# Patient Record
Sex: Female | Born: 1945 | Race: White | Hispanic: No | Smoking: Former smoker
Health system: Southern US, Community
[De-identification: ages and names within clinical notes are randomized; demographics above are authoritative.]

## PROBLEM LIST (undated history)

## (undated) DIAGNOSIS — I10 Essential (primary) hypertension: Secondary | ICD-10-CM

## (undated) HISTORY — PX: ABDOMINAL HYSTERECTOMY: SHX81

## (undated) HISTORY — PX: APPENDECTOMY: SHX54

---

## 2019-06-13 ENCOUNTER — Emergency Department (HOSPITAL_COMMUNITY): Payer: Medicare Other

## 2019-06-13 ENCOUNTER — Other Ambulatory Visit: Payer: Self-pay

## 2019-06-13 ENCOUNTER — Emergency Department (HOSPITAL_COMMUNITY)
Admission: EM | Admit: 2019-06-13 | Discharge: 2019-06-13 | Disposition: A | Discharge status: Home / Self Care | Payer: Medicare Other | Attending: Emergency Medicine | Admitting: Emergency Medicine

## 2019-06-13 DIAGNOSIS — R197 Diarrhea, unspecified: Secondary | ICD-10-CM | POA: Insufficient documentation

## 2019-06-13 DIAGNOSIS — R1030 Lower abdominal pain, unspecified: Secondary | ICD-10-CM | POA: Diagnosis not present

## 2019-06-13 DIAGNOSIS — K5900 Constipation, unspecified: Secondary | ICD-10-CM | POA: Diagnosis not present

## 2019-06-13 DIAGNOSIS — Z79899 Other long term (current) drug therapy: Secondary | ICD-10-CM | POA: Diagnosis not present

## 2019-06-13 DIAGNOSIS — R109 Unspecified abdominal pain: Secondary | ICD-10-CM

## 2019-06-13 LAB — COMPREHENSIVE METABOLIC PANEL
ALT: 20 U/L (ref 0–44)
AST: 32 U/L (ref 15–41)
Albumin: 4.3 g/dL (ref 3.5–5.0)
Alkaline Phosphatase: 52 U/L (ref 38–126)
Anion gap: 11 (ref 5–15)
BUN: 18 mg/dL (ref 8–23)
CO2: 24 mmol/L (ref 22–32)
Calcium: 8.9 mg/dL (ref 8.9–10.3)
Chloride: 105 mmol/L (ref 98–111)
Creatinine, Ser: 0.92 mg/dL (ref 0.44–1.00)
GFR calc Af Amer: >60 mL/min (ref >60)
GFR calc non Af Amer: >60 mL/min (ref >60)
Glucose, Bld: 99 mg/dL (ref 70–99)
Potassium: 4.4 mmol/L (ref 3.5–5.1)
Sodium: 140 mmol/L (ref 135–145)
Total Bilirubin: 0.9 mg/dL (ref 0.3–1.2)
Total Protein: 7.6 g/dL (ref 6.5–8.1)

## 2019-06-13 LAB — CBC WITH DIFFERENTIAL/PLATELET
Abs Immature Granulocytes: 0.01 10*3/uL (ref 0.00–0.07)
Basophils Absolute: 0.1 10*3/uL (ref 0.0–0.1)
Basophils Relative: 1 %
Eosinophils Absolute: 0.1 10*3/uL (ref 0.0–0.5)
Eosinophils Relative: 2 %
HCT: 39.4 % (ref 36.0–46.0)
Hemoglobin: 13.2 g/dL (ref 12.0–15.0)
Immature Granulocytes: 0 %
Lymphocytes Relative: 36 %
Lymphs Abs: 2.4 10*3/uL (ref 0.7–4.0)
MCH: 32.7 pg (ref 26.0–34.0)
MCHC: 33.5 g/dL (ref 30.0–36.0)
MCV: 97.5 fL (ref 80.0–100.0)
Monocytes Absolute: 0.5 10*3/uL (ref 0.1–1.0)
Monocytes Relative: 7 %
Neutro Abs: 3.5 10*3/uL (ref 1.7–7.7)
Neutrophils Relative %: 54 %
Platelets: 259 10*3/uL (ref 150–400)
RBC: 4.04 MIL/uL (ref 3.87–5.11)
RDW: 11.6 % (ref 11.5–15.5)
WBC: 6.6 10*3/uL (ref 4.0–10.5)
nRBC: 0 % (ref 0.0–0.2)

## 2019-06-13 LAB — URINALYSIS, ROUTINE W REFLEX MICROSCOPIC
Bacteria, UA: NONE SEEN
Bilirubin Urine: NEGATIVE
Glucose, UA: NEGATIVE mg/dL
Hgb urine dipstick: NEGATIVE
Ketones, ur: 5 mg/dL — AB
Nitrite: NEGATIVE
Protein, ur: NEGATIVE mg/dL
Specific Gravity, Urine: >1.046 — ABNORMAL HIGH (ref 1.005–1.030)
pH: 7 (ref 5.0–8.0)

## 2019-06-13 LAB — LIPASE, BLOOD: Lipase: 66 U/L — ABNORMAL HIGH (ref 11–51)

## 2019-06-13 MED ORDER — IOHEXOL 300 MG/ML  SOLN
100.0000 mL | Freq: Once | INTRAMUSCULAR | Status: AC | PRN
  Administered 2019-06-13: 100 mL via INTRAVENOUS

## 2019-06-13 NOTE — ED Provider Notes (Signed)
[facility]   CSN: [id number] Arrival date & time: 06/13/19  [id number]     History   Chief Complaint Chief Complaint  Patient presents with  . Abdominal Pain    HPI Lauren Leon is a 73 y.o. female.     HPI 73 year old female presents the emergency department worsening lower abdominal pain over the past 5 days.  Currently pain is 2 out of 10.  She denies nausea vomiting.  She has some intermittent constipation and diarrhea.  She has a history of diverticulitis.  She was seen at an urgent care recently and started on what sounds like ciprofloxacin and Flagyl which she completed a course.  She felt like she was doing much better but now her pain is since returned and is worsening over the past 5 days     No past medical history on file.  There are no active problems to display for this patient.   ** The histories are not reviewed yet. Please review them in the "History" navigator section and refresh this SmartLink.   OB History   No obstetric history on file.      Home Medications    Prior to Admission medications   Medication Sig Start Date End Date Taking? Authorizing Provider  Magnesium 250 MG TABS Take 250 mg by mouth 2 (two) times a day.   Yes [provider]  Multiple Vitamin (MULTIVITAMIN WITH MINERALS) TABS tablet Take 1 tablet by mouth daily.   Yes [provider]  pravastatin (PRAVACHOL) 40 MG tablet Take 40 mg by mouth daily.   Yes [provider]  Probiotic Product (PROBIOTIC PO) Take 1 capsule by mouth daily.   Yes [provider]    Family History No family history on file.  Social History Social History   Tobacco Use  . Smoking status: Not on file  Substance Use Topics  . Alcohol use: Not on file  . Drug use: Not on file     Allergies   Bactrim [sulfamethoxazole-trimethoprim] and Bee venom   Review of Systems Review of Systems  All other systems  reviewed and are negative.    Physical Exam Updated Vital Signs BP (!) 163/72   Pulse 73   Temp 98.1 F (36.7 C) (Oral)   Resp 18   Ht 5' (1.524 m)   Wt 53.1 kg   SpO2 98%   BMI 22.85 kg/m   Physical Exam Vitals signs and nursing note reviewed.  Constitutional:      General: She is not in acute distress.    Appearance: She is well-developed.  HENT:     Head: Normocephalic and atraumatic.  Neck:     Musculoskeletal: Normal range of motion.  Cardiovascular:     Rate and Rhythm: Normal rate and regular rhythm.     Heart sounds: Normal heart sounds.  Pulmonary:     Effort: Pulmonary effort is normal.     Breath sounds: Normal breath sounds.  Abdominal:     General: There is no distension.     Palpations: Abdomen is soft.     Tenderness: There is abdominal tenderness in the left lower quadrant.  Musculoskeletal: Normal range of motion.  Skin:    General: Skin is warm and dry.  Neurological:     Mental Status: She is alert and oriented to person, place, and time.  Psychiatric:        Judgment: Judgment normal.      ED Treatments /  Results  Labs (all labs ordered are listed, but only abnormal results are displayed) Labs Reviewed  LIPASE, BLOOD - Abnormal; Notable for the following components:      Result Value   Lipase 66 (*)    All other components within normal limits  URINALYSIS, ROUTINE W REFLEX MICROSCOPIC - Abnormal; Notable for the following components:   Color, Urine STRAW (*)    Specific Gravity, Urine >1.046 (*)    Ketones, ur 5 (*)    Leukocytes,Ua TRACE (*)    All other components within normal limits  CBC WITH DIFFERENTIAL/PLATELET  COMPREHENSIVE METABOLIC PANEL    EKG    Radiology Ct Abdomen Pelvis W Contrast  Result Date: 06/13/2019 CLINICAL DATA:  Abdominal pain. Recent antibiotics for presumed diverticulitis. Recurrent pain EXAM: CT ABDOMEN AND PELVIS WITH CONTRAST TECHNIQUE: Multidetector CT imaging of the abdomen and pelvis was  performed using the standard protocol following bolus administration of intravenous contrast. CONTRAST:  171mL OMNIPAQUE IOHEXOL 300 MG/ML  SOLN COMPARISON:  None. FINDINGS: Lower chest: Lung bases free of infiltrate or effusion. Mild scarring in the left lung base. Hepatobiliary: 1 cm hypodensity left lobe of the liver. Small area of fatty liver adjacent to the falciform ligament. Small layering densities in the gallbladder consistent with small stones. No gallbladder wall thickening or biliary dilatation. Pancreas: Negative Spleen: Negative Adrenals/Urinary Tract: Negative for renal mass or obstruction. Small renal calculi bilaterally in the lower poles. Normal bladder. Stomach/Bowel: Normal stomach. Negative for bowel obstruction. Moderate diverticulosis in the sigmoid colon without evidence of acute inflammation or edema. Scattered diverticula in the left colon. No abscess or mass. Appendix nonvisualized. Vascular/Lymphatic: Mild atherosclerotic aorta without aneurysm. Negative for lymphadenopathy Reproductive: Hysterectomy.  No pelvic mass. Other: Negative for free fluid. Musculoskeletal: Lumbar degenerative change. Grade 1 anterolisthesis L4-5. No acute skeletal abnormality. IMPRESSION: 1. Sigmoid diverticulosis. No evidence of acute inflammation or diverticulitis. No abscess. 2. Appendix not visualized 3. Probable gallstones. 1 cm indeterminate lesion left lobe of liver. No prior studies for comparison. Electronically Signed   By: Franchot Gallo M.D.   On: 06/13/2019 11:49    Procedures Procedures (including critical care time)  Medications Ordered in ED Medications  iohexol (OMNIPAQUE) 300 MG/ML solution 100 mL (100 mLs Intravenous Contrast Given 06/13/19 1120)     Initial Impression / Assessment and Plan / ED Course  I have reviewed the triage vital signs and the nursing notes.  Pertinent labs & imaging results that were available during my care of the patient were reviewed by me and  considered in my medical decision making (see chart for details).        No clear etiology for abdominal pain found based on work-up here in the emergency department.  She is overall well-appearing.  She will need outpatient GI follow-up.  Primary care follow-up.  She is encouraged to return the emergency department for new or worsening symptoms.  No indication for additional work-up or acute hospitalization.  All questions answered.  Final Clinical Impressions(s) / ED Diagnoses   Final diagnoses:  Abdominal pain, unspecified abdominal location    ED Discharge Orders    None       Jola Schmidt, MD 06/13/19 [phone]

## 2019-06-13 NOTE — ED Notes (Signed)
Pt in CT, will round when pt returns

## 2019-06-13 NOTE — ED Triage Notes (Signed)
Pt here with abdominal pain. Pt reports that she was seen at an urgent care and given antibiotics for diverticulitis. Pt reports that she took the full course of antibiotics and then felt better. Pt reports the pain restarted about 5 days ago. Pt reports pain of a 2/10 at present time. Pt denies any vomiting does report some intermittent constipation and some diarrhea as well.

## 2019-06-13 NOTE — ED Notes (Signed)
Pt. Stated, I have a cell phone and its just my husband so Im ok. Voiced gratitude

## 2020-01-24 ENCOUNTER — Ambulatory Visit: Payer: Medicare Other

## 2020-01-31 ENCOUNTER — Ambulatory Visit: Payer: Medicare PPO | Attending: Internal Medicine

## 2020-01-31 DIAGNOSIS — Z23 Encounter for immunization: Secondary | ICD-10-CM | POA: Insufficient documentation

## 2020-01-31 NOTE — Progress Notes (Signed)
   Covid-19 Vaccination Clinic  Name:  Lauren Leon    MRN: [id number] DOB: [birth year]  01/31/2020  [name] was observed post Covid-19 immunization for 15 minutes without incidence. She was provided with Vaccine Information Sheet and instruction to access the V-Safe system.   [name] was instructed to call 911 with any severe reactions post vaccine: Marland Kitchen Difficulty breathing  . Swelling of your face and throat  . A fast heartbeat  . A bad rash all over your body  . Dizziness and weakness    Immunizations Administered    Name Date Dose VIS Date Route   Pfizer COVID-19 Vaccine 01/31/2020  5:40 PM 0.3 mL 12/05/2019 Intramuscular   Manufacturer: [employer], [employer]   Lot: [id number]   NDC: [address]-4

## 2020-02-14 ENCOUNTER — Ambulatory Visit: Payer: Medicare Other

## 2020-02-25 ENCOUNTER — Ambulatory Visit: Payer: Medicare PPO | Attending: Internal Medicine

## 2020-02-25 DIAGNOSIS — Z23 Encounter for immunization: Secondary | ICD-10-CM

## 2020-02-25 NOTE — Progress Notes (Signed)
   Covid-19 Vaccination Clinic  Name:  Lauren Leon    MRN: [id number] DOB: 06/28/1946  02/25/2020  [name] was observed post Covid-19 immunization for 15 minutes without incident. She was provided with Vaccine Information Sheet and instruction to access the V-Safe system.   [name] was instructed to call 911 with any severe reactions post vaccine: Marland Kitchen Difficulty breathing  . Swelling of face and throat  . A fast heartbeat  . A bad rash all over body  . Dizziness and weakness   Immunizations Administered    Name Date Dose VIS Date Route   Pfizer COVID-19 Vaccine 02/25/2020  2:38 PM 0.3 mL 12/05/2019 Intramuscular   Manufacturer: [employer], [employer]   Lot: [id number]   NDC: [address]-7

## 2021-03-16 IMAGING — CT CT ABDOMEN AND PELVIS WITH CONTRAST
2 of 5 series · 16 of 46 positions shown, 18 images · IV contrast (Omni 300)
Comparison: None.

CLINICAL DATA: Abdominal pain. Recent antibiotics for presumed
diverticulitis. Recurrent pain

EXAM:
CT ABDOMEN AND PELVIS WITH CONTRAST
TECHNIQUE: Multidetector CT imaging of the abdomen and pelvis was performed
using the standard protocol following bolus administration of
intravenous contrast.
CONTRAST:  100mL OMNIPAQUE IOHEXOL 300 MG/ML  SOLN

[Series 3: a/p w/ 5mm · axial · 0.83mm/px · z∈[+709,+1084]mm · 13 of 87 slices shown, 15 images]
[im 6/87  soft-tissue]
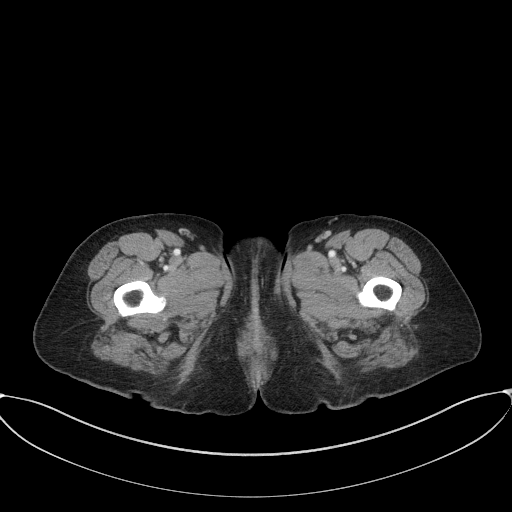
[im 6/87  bone]
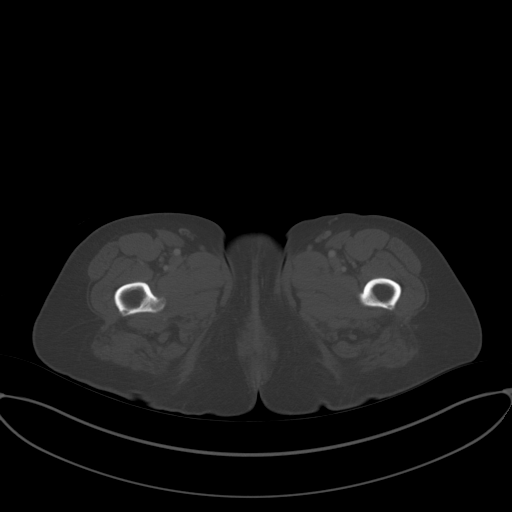
[im 11/87  soft-tissue]
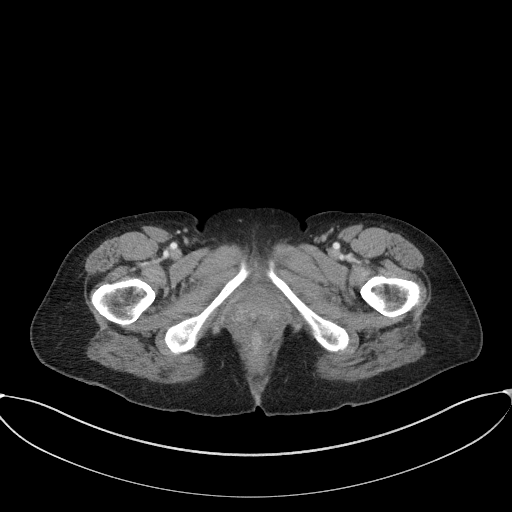
[im 21/87  soft-tissue]
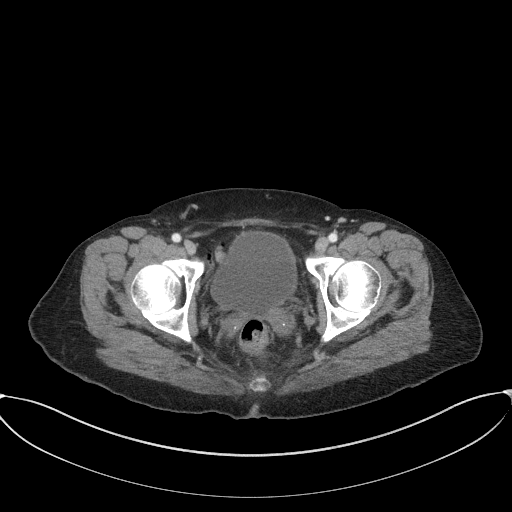
[im 26/87  soft-tissue]
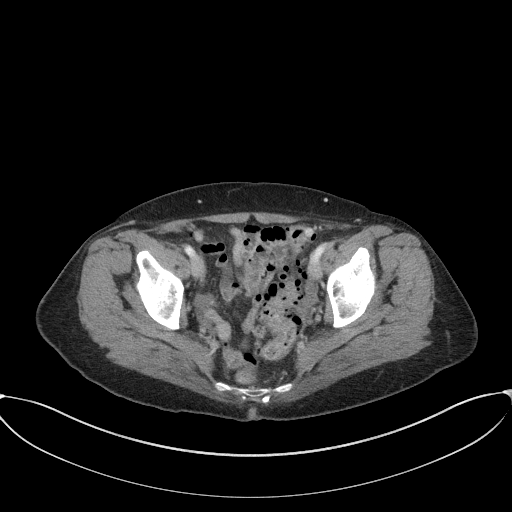
[im 31/87  soft-tissue]
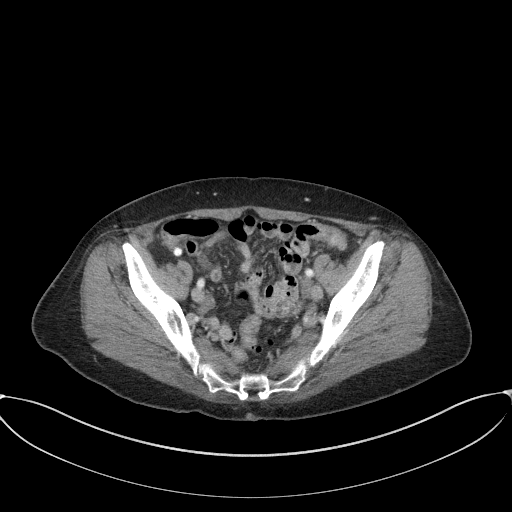
[im 36/87  soft-tissue]
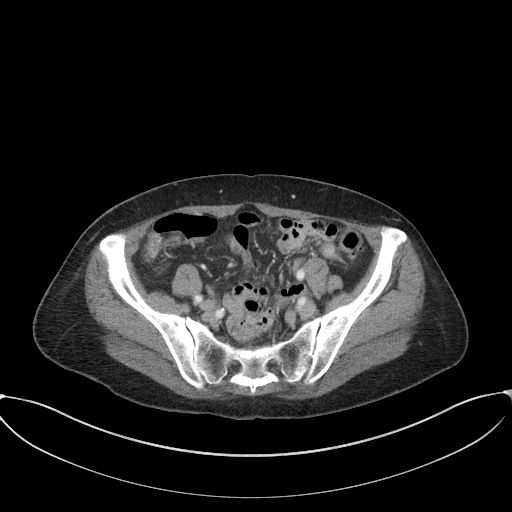
[im 46/87  soft-tissue]
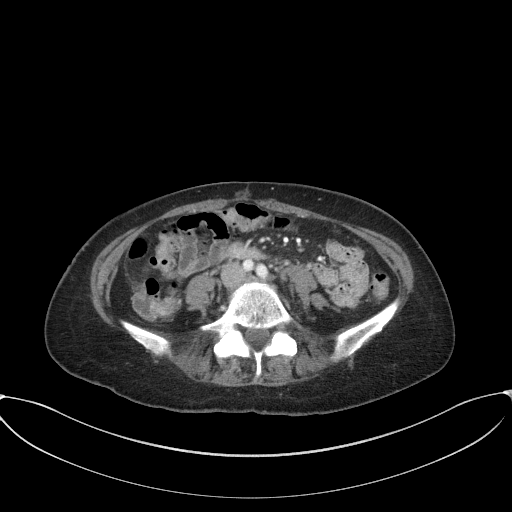
[im 51/87  soft-tissue]
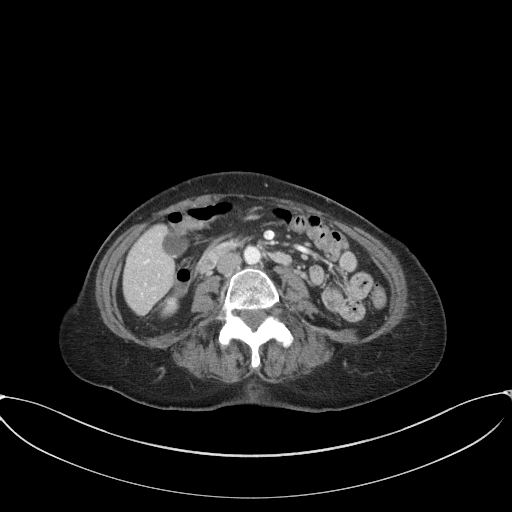
[im 56/87  soft-tissue]
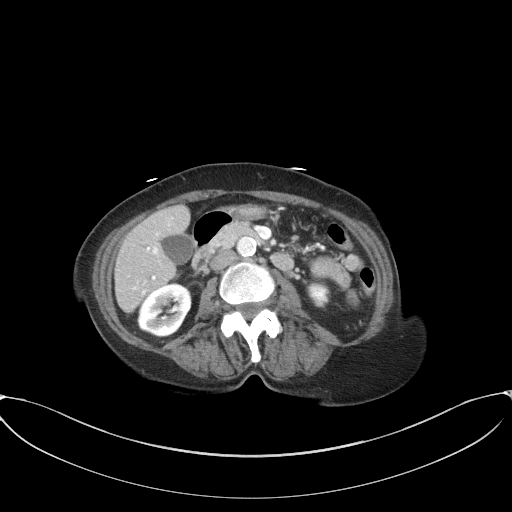
[im 56/87  bone]
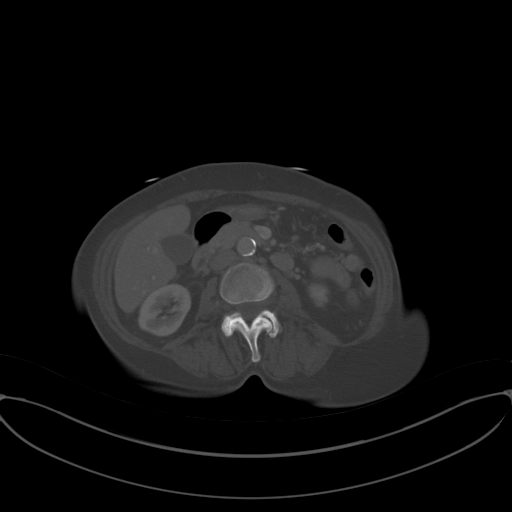
[im 61/87  soft-tissue]
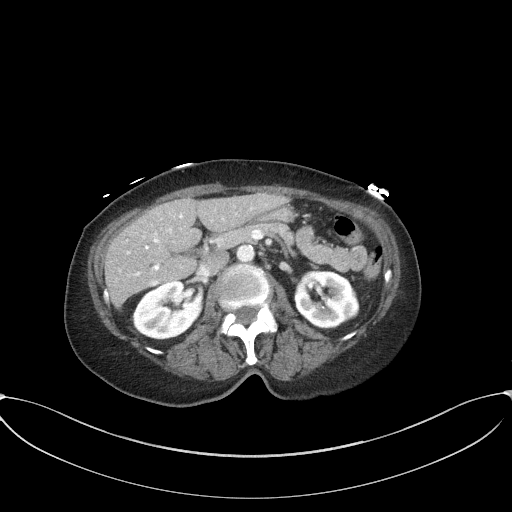
[im 66/87  soft-tissue]
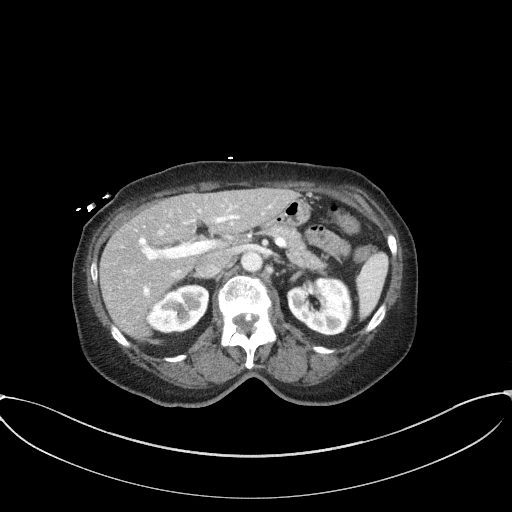
[im 76/87  soft-tissue]
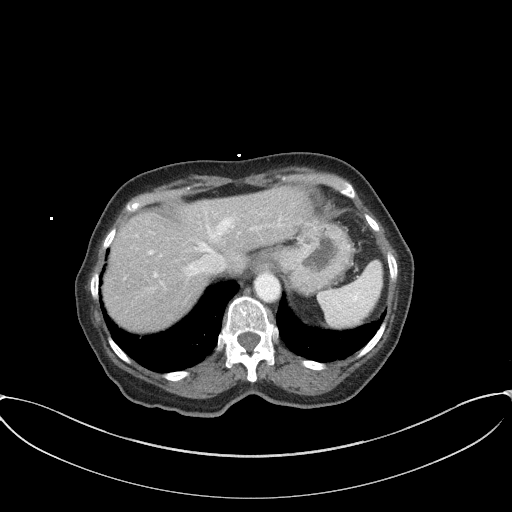
[im 81/87  soft-tissue]
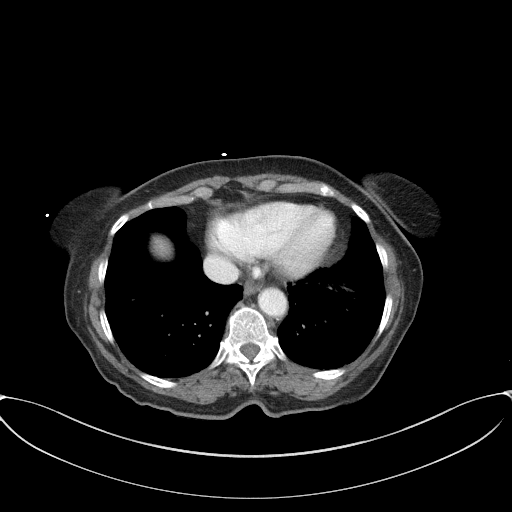

[Series 6: a/p w/ cor · coronal · 0.65mm/px · 3 of 129 slices shown]
[im 43/129  soft-tissue]
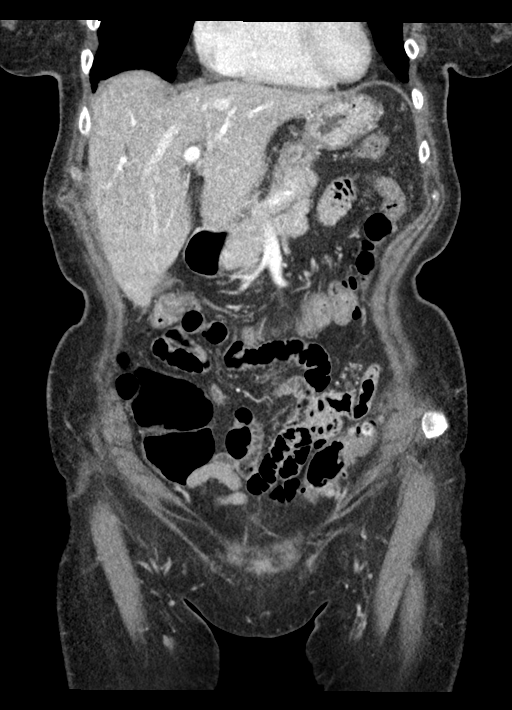
[im 57/129  soft-tissue]
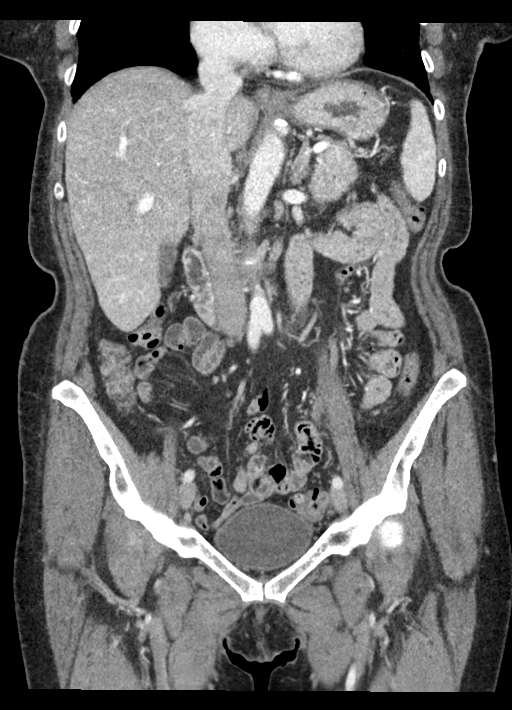
[im 72/129  soft-tissue]
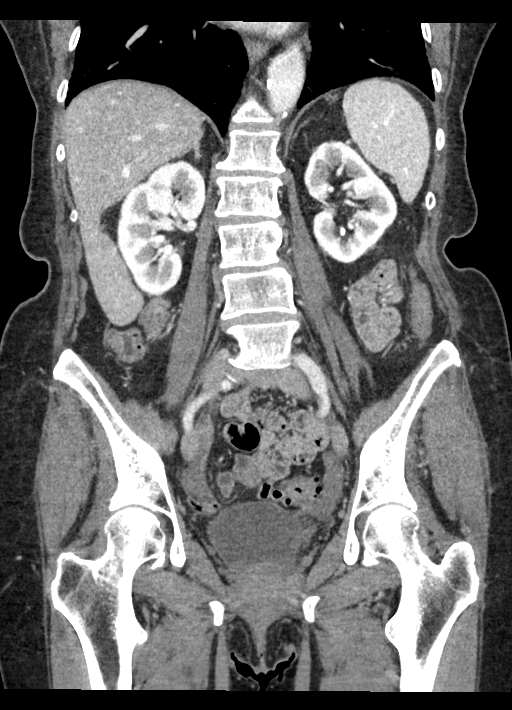

[16 of 46 positions shown; findings below may reference images not displayed]

FINDINGS: Lower chest: Lung bases free of infiltrate or effusion. Mild
scarring in the left lung base.

Hepatobiliary: 1 cm hypodensity left lobe of the liver. Small area
of fatty liver adjacent to the falciform ligament. Small layering
densities in the gallbladder consistent with small stones. No
gallbladder wall thickening or biliary dilatation.

Pancreas: Negative

Spleen: Negative

Adrenals/Urinary Tract: Negative for renal mass or obstruction.
Small renal calculi bilaterally in the lower poles. Normal bladder.

Stomach/Bowel: Normal stomach. Negative for bowel obstruction.
Moderate diverticulosis in the sigmoid colon without evidence of
acute inflammation or edema. Scattered diverticula in the left
colon. No abscess or mass. Appendix nonvisualized.

Vascular/Lymphatic: Mild atherosclerotic aorta without aneurysm.
Negative for lymphadenopathy

Reproductive: Hysterectomy.  No pelvic mass.

Other: Negative for free fluid.

Musculoskeletal: Lumbar degenerative change. Grade 1 anterolisthesis
L4-5. No acute skeletal abnormality.
IMPRESSION: 1. Sigmoid diverticulosis. No evidence of acute inflammation or
diverticulitis. No abscess.
2. Appendix not visualized
3. Probable gallstones. 1 cm indeterminate lesion left lobe of
liver. No prior studies for comparison.

## 2023-08-15 ENCOUNTER — Emergency Department (HOSPITAL_BASED_OUTPATIENT_CLINIC_OR_DEPARTMENT_OTHER): Payer: Medicare PPO

## 2023-08-15 ENCOUNTER — Encounter (HOSPITAL_BASED_OUTPATIENT_CLINIC_OR_DEPARTMENT_OTHER): Payer: Self-pay | Admitting: Emergency Medicine

## 2023-08-15 ENCOUNTER — Other Ambulatory Visit: Payer: Self-pay

## 2023-08-15 ENCOUNTER — Emergency Department (HOSPITAL_BASED_OUTPATIENT_CLINIC_OR_DEPARTMENT_OTHER)
Admission: EM | Admit: 2023-08-15 | Discharge: 2023-08-15 | Disposition: A | Discharge status: Home / Self Care | Payer: Medicare PPO | Attending: Emergency Medicine | Admitting: Emergency Medicine

## 2023-08-15 ENCOUNTER — Other Ambulatory Visit (HOSPITAL_BASED_OUTPATIENT_CLINIC_OR_DEPARTMENT_OTHER): Payer: Self-pay

## 2023-08-15 DIAGNOSIS — X58XXXA Exposure to other specified factors, initial encounter: Secondary | ICD-10-CM | POA: Diagnosis not present

## 2023-08-15 DIAGNOSIS — S161XXA Strain of muscle, fascia and tendon at neck level, initial encounter: Secondary | ICD-10-CM | POA: Diagnosis not present

## 2023-08-15 DIAGNOSIS — S199XXA Unspecified injury of neck, initial encounter: Secondary | ICD-10-CM | POA: Diagnosis present

## 2023-08-15 DIAGNOSIS — R079 Chest pain, unspecified: Secondary | ICD-10-CM | POA: Insufficient documentation

## 2023-08-15 DIAGNOSIS — M542 Cervicalgia: Secondary | ICD-10-CM

## 2023-08-15 HISTORY — DX: Essential (primary) hypertension: I10

## 2023-08-15 LAB — CBC WITH DIFFERENTIAL/PLATELET
Abs Immature Granulocytes: 0.02 10*3/uL (ref 0.00–0.07)
Basophils Absolute: 0.1 10*3/uL (ref 0.0–0.1)
Basophils Relative: 1 %
Eosinophils Absolute: 0.1 10*3/uL (ref 0.0–0.5)
Eosinophils Relative: 2 %
HCT: 33.9 % — ABNORMAL LOW (ref 36.0–46.0)
Hemoglobin: 11.3 g/dL — ABNORMAL LOW (ref 12.0–15.0)
Immature Granulocytes: 0 %
Lymphocytes Relative: 38 %
Lymphs Abs: 2.5 10*3/uL (ref 0.7–4.0)
MCH: 32.8 pg (ref 26.0–34.0)
MCHC: 33.3 g/dL (ref 30.0–36.0)
MCV: 98.3 fL (ref 80.0–100.0)
Monocytes Absolute: 0.5 10*3/uL (ref 0.1–1.0)
Monocytes Relative: 8 %
Neutro Abs: 3.3 10*3/uL (ref 1.7–7.7)
Neutrophils Relative %: 51 %
Platelets: 281 10*3/uL (ref 150–400)
RBC: 3.45 MIL/uL — ABNORMAL LOW (ref 3.87–5.11)
RDW: 12.4 % (ref 11.5–15.5)
WBC: 6.6 10*3/uL (ref 4.0–10.5)
nRBC: 0 % (ref 0.0–0.2)

## 2023-08-15 LAB — COMPREHENSIVE METABOLIC PANEL
ALT: 13 U/L (ref 0–44)
AST: 22 U/L (ref 15–41)
Albumin: 4.1 g/dL (ref 3.5–5.0)
Alkaline Phosphatase: 56 U/L (ref 38–126)
Anion gap: 8 (ref 5–15)
BUN: 21 mg/dL (ref 8–23)
CO2: 27 mmol/L (ref 22–32)
Calcium: 9.5 mg/dL (ref 8.9–10.3)
Chloride: 103 mmol/L (ref 98–111)
Creatinine, Ser: 1.09 mg/dL — ABNORMAL HIGH (ref 0.44–1.00)
GFR, Estimated: 52 mL/min — ABNORMAL LOW (ref >60)
Glucose, Bld: 85 mg/dL (ref 70–99)
Potassium: 4.4 mmol/L (ref 3.5–5.1)
Sodium: 138 mmol/L (ref 135–145)
Total Bilirubin: 0.4 mg/dL (ref 0.3–1.2)
Total Protein: 6.7 g/dL (ref 6.5–8.1)

## 2023-08-15 MED ORDER — IOHEXOL 300 MG/ML  SOLN
100.0000 mL | Freq: Once | INTRAMUSCULAR | Status: AC | PRN
  Administered 2023-08-15: 75 mL via INTRAVENOUS

## 2023-08-15 NOTE — Discharge Instructions (Signed)
Your CT imaging was overall reassuring, did show persistent evidence of interstitial lung disease and a pulmonary nodule which is stable.  There is no evidence of acute abnormality in your neck or chest that could explain your symptoms.  Your symptoms could be due to a cervical

## 2023-08-15 NOTE — ED Triage Notes (Signed)
Pt via pov from UC with neck swelling and pain that has spread to jaw and ear. Pt was sent by UC for CT scan. Denies any difficulty breathing. Reports that her ear feels "clogged up." Pt alert & oriented, nad noted.

## 2023-08-15 NOTE — ED Provider Notes (Signed)
  Physical Exam  BP (!) 167/79 (BP Location: Right Arm)   Pulse (!) 58   Temp 97.9 F (36.6 C) (Oral)   Resp 15   Ht 5' (1.524 m)   Wt 58.5 kg   SpO2 99%   BMI 25.19 kg/m     Procedures  Procedures  ED Course / MDM    Medical Decision Making Amount and/or Complexity of Data Reviewed Labs: ordered. Radiology: ordered.  Risk Prescription drug management.   47F presenting from UC for a neck mass, CT neck and chest pending. No concern for airway obstruction.   CT Neck:  IMPRESSION:  No acute abnormality or mass identified in the neck.    CT Chest:  IMPRESSION:  1. No evidence of chest wall mass or acute findings in the chest.  2. Stable interstitial lung disease. See previously performed  high-resolution chest CT for further discussion.  3. Aortic Atherosclerosis (ICD10-I70.0).    The was examined, she has good range of motion of the neck superiorly and inferiorly and is able to range the neck to the right without difficulty.  When she turns her head to the left, she does have some pain in her left neck.  She states that she woke up this way.  It is possible she has suffered a cervical strain.  No abnormalities were seen on CT imaging.  She has no leukocytosis with no palpable node or mass appreciated.  She has no trismus and is protecting her airway and maintaining her airway.  I believe that she is stable for discharge at this time.  I recommended Tylenol, heating pad, ice to the affected area and lidocaine patch as needed, follow-up with her PCP to ensure resolution.     Ernie Avena, MD 08/15/23 [phone]

## 2023-08-15 NOTE — ED Notes (Signed)
Pt discharged home and given discharge paperwork. Opportunities given for questions. Pt verbalizes understanding. PIV removed x1. Stone,Heather R , RN 

## 2023-08-15 NOTE — ED Provider Notes (Signed)
[place] EMERGENCY DEPARTMENT AT [facility] Provider Note   CSN: [id number] Arrival date & time: 08/15/23  1203     History {Add pertinent medical, surgical, social history, OB history to HPI:1} Chief Complaint  Patient presents with   Neck Pain    Lauren Leon is a 77 y.o. female.  HPI Patient reports she started to notice neck pain and stiffness on the left side about a week ago.  At first she thought it was a strained neck.  She was trying heating pads and getting some relief.  She reports however the stiffness and pain has persisted and now she has a swollen uncomfortable area at the base of her neck and the top of her clavicle.  She reports that it is very hard for her to turn her head to the left.  No difficulty swallowing, no difficulty breathing.  No history of similar episode.  No injury.  No fevers no chills no sore throat leading up to symptoms.  She reports she does perceive some pain in the left upper chest as well.    Home Medications Prior to Admission medications   Medication Sig Start Date End Date Taking? Authorizing Provider  escitalopram (LEXAPRO) 10 MG tablet Take 10 mg by mouth daily. 09/29/22  Yes [provider]  famotidine (PEPCID) 40 MG tablet Take 1 tablet by mouth daily. 12/07/22  Yes [provider]  losartan (COZAAR) 50 MG tablet Take 1 tablet by mouth daily. 01/22/23  Yes [provider]  Magnesium 250 MG TABS Take 250 mg by mouth 2 (two) times a day.    [provider]  Multiple Vitamin (MULTIVITAMIN WITH MINERALS) TABS tablet Take 1 tablet by mouth daily.    [provider]  pravastatin (PRAVACHOL) 40 MG tablet Take 40 mg by mouth daily.    [provider]  Probiotic Product (PROBIOTIC PO) Take 1 capsule by mouth daily.    [provider]  PROLIA 60 MG/ML SOSY injection Inject into the skin.    [provider]      Allergies    Bactrim  [sulfamethoxazole-trimethoprim] and Bee venom    Review of Systems   Review of Systems  Physical Exam Updated Vital Signs BP (!) 167/79 (BP Location: Right Arm)   Pulse (!) 58   Temp 97.9 F (36.6 C) (Oral)   Resp 15   Ht 5' (1.524 m)   Wt 58.5 kg   SpO2 99%   BMI 25.19 kg/m  Physical Exam Constitutional:      Comments: Alert nontoxic clear mental status no respiratory distress  HENT:     Head: Normocephalic and atraumatic.     Nose: Nose normal.     Mouth/Throat:     Mouth: Mucous membranes are moist.     Pharynx: Oropharynx is clear.  Eyes:     Extraocular Movements: Extraocular movements intact.     Conjunctiva/sclera: Conjunctivae normal.  Neck:     Comments: Patient has tenderness to palpation in the posterior paraspinous soft tissues on the left.  She also has tenderness and fullness to palpation along the SCM on the left.  There is palpable tender fullness in the supraclavicular area on the left with some tenderness and irregularity over the left clavicle.  No visible surface soft tissue abnormalities.  No rashes, no erythema. Cardiovascular:     Rate and Rhythm: Normal rate and regular rhythm.  Pulmonary:     Effort: Pulmonary effort is normal.  Breath sounds: Normal breath sounds.  Abdominal:     General: There is no distension.     Palpations: Abdomen is soft.     Tenderness: There is no abdominal tenderness. There is no guarding.  Musculoskeletal:        General: No swelling. Normal range of motion.     Right lower leg: No edema.     Left lower leg: No edema.  Skin:    General: Skin is warm and dry.  Neurological:     General: No focal deficit present.     Mental Status: She is oriented to person, place, and time.     Motor: No weakness.     Coordination: Coordination normal.     ED Results / Procedures / Treatments   Labs (all labs ordered are listed, but only abnormal results are displayed) Labs Reviewed  COMPREHENSIVE METABOLIC PANEL  CBC  WITH DIFFERENTIAL/PLATELET    EKG None  Radiology No results found.  Procedures Procedures  {Document cardiac monitor, telemetry assessment procedure when appropriate:1}  Medications Ordered in ED Medications - No data to display  ED Course/ Medical Decision Making/ A&P   {   Click here for ABCD2, HEART and other calculatorsREFRESH Note before signing :1}                              Medical Decision Making  ***  {Document critical care time when appropriate:1} {Document review of labs and clinical decision tools ie heart score, Chads2Vasc2 etc:1}  {Document your independent review of radiology images, and any outside records:1} {Document your discussion with family members, caretakers, and with consultants:1} {Document social determinants of health affecting pt's care:1} {Document your decision making why or why not admission, treatments were needed:1} Final Clinical Impression(s) / ED Diagnoses Final diagnoses:  None    Rx / DC Orders ED Discharge Orders     None
